# Patient Record
Sex: Female | Born: 1990 | Race: White | Hispanic: No | Marital: Married | Smoking: Never smoker
Health system: Southern US, Community
[De-identification: ages and names within clinical notes are randomized; demographics above are authoritative.]

---

## 2018-08-15 ENCOUNTER — Other Ambulatory Visit: Payer: Self-pay

## 2018-08-15 DIAGNOSIS — X509XXA Other and unspecified overexertion or strenuous movements or postures, initial encounter: Secondary | ICD-10-CM | POA: Diagnosis not present

## 2018-08-15 DIAGNOSIS — Y929 Unspecified place or not applicable: Secondary | ICD-10-CM | POA: Diagnosis not present

## 2018-08-15 DIAGNOSIS — Y998 Other external cause status: Secondary | ICD-10-CM | POA: Diagnosis not present

## 2018-08-15 DIAGNOSIS — Y9351 Activity, roller skating (inline) and skateboarding: Secondary | ICD-10-CM | POA: Insufficient documentation

## 2018-08-15 DIAGNOSIS — Z79899 Other long term (current) drug therapy: Secondary | ICD-10-CM | POA: Diagnosis not present

## 2018-08-15 DIAGNOSIS — S82852A Displaced trimalleolar fracture of left lower leg, initial encounter for closed fracture: Secondary | ICD-10-CM | POA: Insufficient documentation

## 2018-08-15 DIAGNOSIS — S99912A Unspecified injury of left ankle, initial encounter: Secondary | ICD-10-CM | POA: Diagnosis present

## 2018-08-15 NOTE — ED Triage Notes (Signed)
Fell while roller skating.  Reports pain to left ankle.

## 2018-08-16 ENCOUNTER — Emergency Department
Admission: EM | Admit: 2018-08-16 | Discharge: 2018-08-16 | Disposition: A | Discharge status: Home / Self Care | Payer: BLUE CROSS/BLUE SHIELD | Attending: Student in an Organized Health Care Education/Training Program | Admitting: Student in an Organized Health Care Education/Training Program

## 2018-08-16 ENCOUNTER — Emergency Department: Payer: BLUE CROSS/BLUE SHIELD

## 2018-08-16 DIAGNOSIS — T1490XA Injury, unspecified, initial encounter: Secondary | ICD-10-CM

## 2018-08-16 DIAGNOSIS — S82852A Displaced trimalleolar fracture of left lower leg, initial encounter for closed fracture: Secondary | ICD-10-CM

## 2018-08-16 LAB — I-STAT BETA HCG BLOOD, ED (NOT ORDERABLE): I-stat hCG, quantitative: <5 m[IU]/mL (ref <5)

## 2018-08-16 MED ORDER — PROPOFOL 10 MG/ML IV BOLUS
0.5000 mg/kg | Freq: Once | INTRAVENOUS | Status: DC
  Filled 2018-08-16: qty 20

## 2018-08-16 MED ORDER — METOCLOPRAMIDE HCL 5 MG/ML IJ SOLN
10.0000 mg | Freq: Once | INTRAMUSCULAR | Status: AC
  Administered 2018-08-16: 10 mg via INTRAVENOUS
  Filled 2018-08-16: qty 2

## 2018-08-16 MED ORDER — PROPOFOL 10 MG/ML IV BOLUS
INTRAVENOUS | Status: AC | PRN
  Administered 2018-08-16: 35 mg via INTRAVENOUS
  Administered 2018-08-16: 20 mg via INTRAVENOUS

## 2018-08-16 MED ORDER — HYDROCODONE-ACETAMINOPHEN 5-325 MG PO TABS: 1.0000 | ORAL_TABLET | ORAL | 0 refills | Status: DC | PRN

## 2018-08-16 MED ORDER — KETAMINE HCL 10 MG/ML IJ SOLN
INTRAMUSCULAR | Status: AC | PRN
  Administered 2018-08-16: 35 mg via INTRAVENOUS

## 2018-08-16 MED ORDER — PROMETHAZINE HCL 25 MG/ML IJ SOLN
12.5000 mg | Freq: Four times a day (QID) | INTRAMUSCULAR | Status: DC | PRN
  Administered 2018-08-16: 12.5 mg via INTRAVENOUS
  Filled 2018-08-16: qty 1

## 2018-08-16 MED ORDER — MORPHINE SULFATE (PF) 4 MG/ML IV SOLN
4.0000 mg | INTRAVENOUS | Status: DC | PRN
  Administered 2018-08-16: 4 mg via INTRAVENOUS
  Filled 2018-08-16: qty 1

## 2018-08-16 MED ORDER — KETAMINE HCL 10 MG/ML IJ SOLN
INTRAMUSCULAR | Status: AC | PRN
  Administered 2018-08-16: 20 mg via INTRAVENOUS

## 2018-08-16 MED ORDER — KETAMINE HCL 10 MG/ML IJ SOLN
1.0000 mg/kg | Freq: Once | INTRAMUSCULAR | Status: DC
  Filled 2018-08-16: qty 1

## 2018-08-16 MED ORDER — PROPOFOL 10 MG/ML IV BOLUS
INTRAVENOUS | Status: AC | PRN
  Administered 2018-08-16: 30 mg via INTRAVENOUS

## 2018-08-16 NOTE — ED Notes (Signed)
Pt having PO challenge at this time.

## 2018-08-16 NOTE — Discharge Instructions (Signed)
These follow-up with Dr. Rosita KeaMenz of orthopedics by making a phone call to the office on Monday for clinic appointment.  Keep leg elevated while at home to help reduce swelling.  Please take daily baby aspirin to help reduce risk of blood clot while in splint.

## 2018-08-16 NOTE — ED Notes (Signed)
Pt is on medhold at this time.

## 2018-08-16 NOTE — ED Provider Notes (Signed)
[facility] Emergency Department Provider Note    First MD Initiated Contact with Patient 08/16/18 0045     (approximate)  I have reviewed the triage vital signs and the nursing notes.   HISTORY  Chief Complaint Ankle Pain    HPI Holly Wyatt is a 27 y.o. female presents to the ER for evaluation of left ankle pain that occurred while she was rollerblading tonight.  Denies any other injury.  States that she rolled her ankle and felt a popping sensation with immediate severe pain.  Denies any knee pain.  Not any blood thinners.  Previously healthy.  Denies any other associated injury.    No past medical history on file. No family history on file.  There are no active problems to display for this patient.     Prior to Admission medications   Medication Sig Start Date End Date Taking? Authorizing Provider  norethindrone-ethinyl estradiol (JUNEL FE 1/20) 1-20 MG-MCG tablet Take 1 tablet by mouth daily. 08/03/17  Yes [provider]  HYDROcodone-acetaminophen (NORCO) 5-325 MG tablet Take 1 tablet by mouth every 4 (four) hours as needed for moderate pain. 08/16/18   Willy Eddy, MD    Allergies Patient has no known allergies.    Social History Social History   Tobacco Use  . Smoking status: Not on file  Substance Use Topics  . Alcohol use: Not on file  . Drug use: Not on file    Review of Systems Patient denies headaches, rhinorrhea, blurry vision, numbness, shortness of breath, chest pain, edema, cough, abdominal pain, nausea, vomiting, diarrhea, dysuria, fevers, rashes or hallucinations unless otherwise stated above in HPI. ____________________________________________   PHYSICAL EXAM:  VITAL SIGNS: Vitals:   08/16/18 0315 08/16/18 0330  BP: 137/75 (!) 143/86  Pulse: 100 (!) 107  Resp: 19 15  Temp:    SpO2: 100% 100%    Constitutional: Alert and oriented.  Eyes: Conjunctivae are normal.  Head: Atraumatic. Nose: No  congestion/rhinnorhea. Mouth/Throat: Mucous membranes are moist.   Neck: No stridor. Painless ROM.  Cardiovascular: Normal rate, regular rhythm. Grossly normal heart sounds.  Good peripheral circulation. Respiratory: Normal respiratory effort.  No retractions. Lungs CTAB. Gastrointestinal: Soft and nontender. No distention. No abdominal bruits. No CVA tenderness. Genitourinary:  Musculoskeletal: Obvious deformity to left ankle with surrounding swelling and tenderness to both malleoli.  Neurovascular intact distally.  No joint effusions. Neurologic:  Normal speech and language. No gross focal neurologic deficits are appreciated. No facial droop Skin:  Skin is warm, dry and intact. No rash noted. Psychiatric: Mood and affect are normal. Speech and behavior are normal.  ____________________________________________   LABS (all labs ordered are listed, but only abnormal results are displayed)  Results for orders placed or performed during the hospital encounter of 08/16/18 (from the past 24 hour(s))  I-Stat beta hCG blood, ED     Status: None   Collection Time: 08/16/18  1:28 AM  Result Value Ref Range   I-stat hCG, quantitative <5.0 <5 mIU/mL   Comment 3           ____________________________________________ ____________________________________________  RADIOLOGY  I personally reviewed all radiographic images ordered to evaluate for the above acute complaints and reviewed radiology reports and findings.  These findings were personally discussed with the patient.  Please see medical record for radiology report.  ____________________________________________   PROCEDURES  Procedure(s) performed:  .Ortho Injury Treatment Date/Time: 08/16/2018 4:01 AM Performed by: Willy Eddy, MD Authorized by: Willy Eddy, MD  Consent:    Consent obtained:  Verbal   Consent given by:  Patient   Risks discussed:  Irreducible dislocation, recurrent dislocation, stiffness, vascular  damage, restricted joint movement, nerve damage and fracture   Alternatives discussed:  Alternative treatment, no treatment, immobilization and referralInjury location: ankle Location details: left ankle Injury type: fracture-dislocation Fracture type: trimalleolar Pre-procedure neurovascular assessment: neurovascularly intact  Patient sedated: Yes. Refer to sedation procedure documentation for details of sedation. Manipulation performed: yes Skin traction used: yes Reduction successful: yes X-ray confirmed reduction: yes Immobilization: splint and crutches Splint type: ankle stirrup Supplies used: plaster Post-procedure neurovascular assessment: post-procedure neurovascularly intact Patient tolerance: Patient tolerated the procedure well with no immediate complications  .Sedation Date/Time: 08/16/2018 4:02 AM Performed by: Willy Eddyobinson, Quinlynn Cuthbert, MD Authorized by: Willy Eddyobinson, Drewey Begue, MD   Consent:    Consent obtained:  Written (electronic informed consent)   Risks discussed:  Allergic reaction, dysrhythmia, inadequate sedation, nausea, vomiting, respiratory compromise necessitating ventilatory assistance and intubation, prolonged sedation necessitating reversal and prolonged hypoxia resulting in organ damage Universal protocol:    Procedure explained and questions answered to patient or proxy's satisfaction: yes     Relevant documents present and verified: yes     Test results available and properly labeled: yes     Imaging studies available: yes     Required blood products, implants, devices, and special equipment available: yes     Immediately prior to procedure a time out was called: yes     Patient identity confirmation method:  Arm band Pre-sedation assessment:    Time since last food or drink:  4   ASA classification: class 1 - normal, healthy patient     Neck mobility: normal     Mouth opening:  3 or more finger widths   Thyromental distance:  4 finger widths   Mallampati  score:  I - soft palate, uvula, fauces, pillars visible   Pre-sedation assessments completed and reviewed: airway patency, cardiovascular function, mental status, pain level and respiratory function   Immediate pre-procedure details:    Reassessment: Patient reassessed immediately prior to procedure     Reviewed: vital signs, relevant labs/tests and NPO status     Verified: bag valve mask available, emergency equipment available, intubation equipment available, IV patency confirmed, oxygen available, reversal medications available and suction available   Procedure details (see MAR for exact dosages):    Sedation:  Propofol and ketamine   Intra-procedure monitoring:  Blood pressure monitoring, continuous pulse oximetry, cardiac monitor, frequent vital sign checks and frequent LOC assessments   Total Provider sedation time (minutes):  23 Post-procedure details:    Attendance: Constant attendance by certified staff until patient recovered     Recovery: Patient returned to pre-procedure baseline     Post-sedation assessments completed and reviewed: airway patency, cardiovascular function, hydration status, mental status and respiratory function     Patient is stable for discharge or admission: yes     Patient tolerance:  Tolerated well, no immediate complications      Critical Care performed: no ____________________________________________   INITIAL IMPRESSION / ASSESSMENT AND PLAN / ED COURSE  Pertinent labs & imaging results that were available during my care of the patient were reviewed by me and considered in my medical decision making (see chart for details).   DDX: fracture, dislocation, sprain  Holly Wyatt is a 27 y.o. who presents to the ED with acute trimalleolar fracture dislocation.  No other associated injury.  Will provide IV pain medication.  Will require  sedation and reduction.  Clinical Course as of Aug 16 716  Wynelle LinkSun Aug 16, 2018  0119 Consulted with Dr. Rosita KeaMenz of  orthopedics who agrees with plan for reduction in the ER with outpatient follow-up.   [PR]  0247 Patient tolerated reduction without complication.   [PR]    Clinical Course User Index [PR] Willy Eddyobinson, Leighanne Adolph, MD     As part of my medical decision making, I reviewed the following data within the electronic MEDICAL RECORD NUMBER Nursing notes reviewed and incorporated, Labs reviewed, notes from prior ED visits.   ____________________________________________   FINAL CLINICAL IMPRESSION(S) / ED DIAGNOSES  Final diagnoses:  Trimalleolar fracture of ankle, closed, left, initial encounter      NEW MEDICATIONS STARTED DURING THIS VISIT:  Discharge Medication List as of 08/16/2018  3:04 AM       Note:  This document was prepared using Dragon voice recognition software and may include unintentional dictation errors.    Willy Eddyobinson, Dyllan Kats, MD 08/16/18 [phone]

## 2018-08-19 MED ORDER — CEFAZOLIN SODIUM-DEXTROSE 2-4 GM/100ML-% IV SOLN: 2.0000 g | Freq: Once | INTRAVENOUS | Status: DC

## 2018-08-20 ENCOUNTER — Encounter: Payer: Self-pay | Admitting: Anesthesiology

## 2018-08-20 ENCOUNTER — Other Ambulatory Visit: Payer: Self-pay

## 2018-08-20 ENCOUNTER — Ambulatory Visit
Admission: RE | Admit: 2018-08-20 | Discharge: 2018-08-20 | Disposition: A | Discharge status: Home / Self Care | Payer: BLUE CROSS/BLUE SHIELD | Attending: Orthopedic Surgery | Admitting: Orthopedic Surgery

## 2018-08-20 ENCOUNTER — Encounter
Admission: RE | Disposition: A | Discharge status: Home / Self Care | Payer: Self-pay | Source: Home / Self Care | Attending: Orthopedic Surgery

## 2018-08-20 ENCOUNTER — Other Ambulatory Visit
Admission: RE | Admit: 2018-08-20 | Discharge: 2018-08-20 | Disposition: A | Discharge status: Home / Self Care | Payer: BLUE CROSS/BLUE SHIELD | Source: Ambulatory Visit | Attending: *Deleted | Admitting: *Deleted

## 2018-08-20 DIAGNOSIS — S82852A Displaced trimalleolar fracture of left lower leg, initial encounter for closed fracture: Secondary | ICD-10-CM | POA: Insufficient documentation

## 2018-08-20 DIAGNOSIS — R509 Fever, unspecified: Secondary | ICD-10-CM | POA: Insufficient documentation

## 2018-08-20 DIAGNOSIS — R05 Cough: Secondary | ICD-10-CM | POA: Insufficient documentation

## 2018-08-20 DIAGNOSIS — Y9351 Activity, roller skating (inline) and skateboarding: Secondary | ICD-10-CM | POA: Diagnosis not present

## 2018-08-20 DIAGNOSIS — Z5309 Procedure and treatment not carried out because of other contraindication: Secondary | ICD-10-CM | POA: Insufficient documentation

## 2018-08-20 DIAGNOSIS — R42 Dizziness and giddiness: Secondary | ICD-10-CM | POA: Insufficient documentation

## 2018-08-20 LAB — FIBRIN DERIVATIVES D-DIMER (ARMC ONLY): Fibrin derivatives D-dimer (ARMC): 1624.53 ng/mL (FEU) — ABNORMAL HIGH (ref 0.00–499.00)

## 2018-08-20 SURGERY — OPEN REDUCTION INTERNAL FIXATION (ORIF) ANKLE FRACTURE
Anesthesia: Choice | Laterality: Left

## 2018-08-20 MED ORDER — LACTATED RINGERS IV SOLN: INTRAVENOUS | Status: DC

## 2018-08-20 MED ORDER — FAMOTIDINE 20 MG PO TABS: 20.0000 mg | ORAL_TABLET | Freq: Once | ORAL | Status: DC

## 2018-08-20 MED ORDER — FAMOTIDINE 20 MG PO TABS
ORAL_TABLET | ORAL | Status: AC
  Filled 2018-08-20: qty 1

## 2018-08-20 MED ORDER — CEFAZOLIN SODIUM-DEXTROSE 2-4 GM/100ML-% IV SOLN
INTRAVENOUS | Status: AC
  Filled 2018-08-20: qty 100

## 2018-08-20 SURGICAL SUPPLY — 44 items
BANDAGE ACE 4X5 VEL STRL LF (GAUZE/BANDAGES/DRESSINGS) ×6 IMPLANT
CANISTER SUCT 1200ML W/VALVE (MISCELLANEOUS) ×3 IMPLANT
CHLORAPREP W/TINT 26ML (MISCELLANEOUS) ×3 IMPLANT
COVER WAND RF STERILE (DRAPES) ×3 IMPLANT
CUFF TOURN 24 STER (MISCELLANEOUS) IMPLANT
CUFF TOURN 30 STER DUAL PORT (MISCELLANEOUS) IMPLANT
DRAPE FLUOR MINI C-ARM 54X84 (DRAPES) ×3 IMPLANT
DRAPE INCISE IOBAN 66X45 STRL (DRAPES) ×3 IMPLANT
DRAPE U-SHAPE 47X51 STRL (DRAPES) ×3 IMPLANT
DRSG EMULSION OIL 3X8 NADH (GAUZE/BANDAGES/DRESSINGS) ×3 IMPLANT
ELECT CAUTERY BLADE 6.4 (BLADE) ×3 IMPLANT
ELECT REM PT RETURN 9FT ADLT (ELECTROSURGICAL) ×3
ELECTRODE REM PT RTRN 9FT ADLT (ELECTROSURGICAL) ×1 IMPLANT
GAUZE PETRO XEROFOAM 1X8 (MISCELLANEOUS) ×3 IMPLANT
GAUZE SPONGE 4X4 12PLY STRL (GAUZE/BANDAGES/DRESSINGS) ×3 IMPLANT
GLOVE SURG SYN 9.0  PF PI (GLOVE) ×2
GLOVE SURG SYN 9.0 PF PI (GLOVE) ×1 IMPLANT
GOWN SRG 2XL LVL 4 RGLN SLV (GOWNS) ×1 IMPLANT
GOWN STRL NON-REIN 2XL LVL4 (GOWNS) ×2
GOWN STRL REUS W/ TWL LRG LVL3 (GOWN DISPOSABLE) ×1 IMPLANT
GOWN STRL REUS W/TWL LRG LVL3 (GOWN DISPOSABLE) ×2
HEMOVAC 400ML (MISCELLANEOUS)
KIT DRAIN HEMOVAC JP 7FR 400ML (MISCELLANEOUS) IMPLANT
KIT TURNOVER KIT A (KITS) ×3 IMPLANT
LABEL OR SOLS (LABEL) ×3 IMPLANT
NS IRRIG 1000ML POUR BTL (IV SOLUTION) ×3 IMPLANT
PACK EXTREMITY ARMC (MISCELLANEOUS) ×3 IMPLANT
PAD ABD DERMACEA PRESS 5X9 (GAUZE/BANDAGES/DRESSINGS) ×6 IMPLANT
PAD CAST CTTN 4X4 STRL (SOFTGOODS) ×2 IMPLANT
PAD PREP 24X41 OB/GYN DISP (PERSONAL CARE ITEMS) ×3 IMPLANT
PADDING CAST COTTON 4X4 STRL (SOFTGOODS) ×4
SCALPEL PROTECTED #15 DISP (BLADE) ×6 IMPLANT
SPLINT CAST 1 STEP 5X30 WHT (MISCELLANEOUS) ×3 IMPLANT
SPONGE LAP 18X18 RF (DISPOSABLE) ×3 IMPLANT
STAPLER SKIN PROX 35W (STAPLE) ×3 IMPLANT
SUT ETHILON 3-0 FS-10 30 BLK (SUTURE) ×3
SUT MNCRL AB 4-0 PS2 18 (SUTURE) ×6 IMPLANT
SUT VIC AB 0 CT1 36 (SUTURE) ×3 IMPLANT
SUT VIC AB 2-0 SH 27 (SUTURE) ×4
SUT VIC AB 2-0 SH 27XBRD (SUTURE) ×2 IMPLANT
SUT VIC AB 3-0 SH 27 (SUTURE) ×2
SUT VIC AB 3-0 SH 27X BRD (SUTURE) ×1 IMPLANT
SUTURE EHLN 3-0 FS-10 30 BLK (SUTURE) ×1 IMPLANT
SYR 10ML LL (SYRINGE) ×3 IMPLANT

## 2018-08-20 NOTE — Anesthesia Preprocedure Evaluation (Deleted)
Anesthesia Evaluation Anesthesia Physical Anesthesia Plan  ASA:   Anesthesia Plan:    Post-op Pain Management:    Induction:   PONV Risk Score and Plan:   Airway Management Planned:   Additional Equipment:   Intra-op Plan:   Post-operative Plan:   Informed Consent:   Plan Discussed with:   Anesthesia Plan Comments: (Pt with cold/cough over last 2 days with fever spikes to 102. Spoke with pt and Dr. Rosita KeaMenz. Will postpone for now. Pt will seek treatment for symptoms.)        Anesthesia Quick Evaluation

## 2018-08-27 ENCOUNTER — Ambulatory Visit
Admission: RE | Admit: 2018-08-27 | Payer: BLUE CROSS/BLUE SHIELD | Source: Home / Self Care | Admitting: Orthopedic Surgery

## 2018-08-27 ENCOUNTER — Encounter: Admission: RE | Payer: Self-pay | Source: Home / Self Care

## 2018-08-27 SURGERY — OPEN REDUCTION INTERNAL FIXATION (ORIF) ANKLE FRACTURE
Anesthesia: Choice | Laterality: Left

## 2019-05-06 ENCOUNTER — Other Ambulatory Visit: Payer: Self-pay

## 2019-05-06 DIAGNOSIS — Z20822 Contact with and (suspected) exposure to covid-19: Secondary | ICD-10-CM

## 2019-05-07 LAB — NOVEL CORONAVIRUS, NAA: SARS-CoV-2, NAA: NOT DETECTED

## 2021-12-31 ENCOUNTER — Observation Stay: Payer: BLUE CROSS/BLUE SHIELD | Admitting: Certified Registered"

## 2021-12-31 ENCOUNTER — Other Ambulatory Visit: Payer: Self-pay

## 2021-12-31 ENCOUNTER — Emergency Department: Payer: BLUE CROSS/BLUE SHIELD

## 2021-12-31 ENCOUNTER — Observation Stay
Admission: EM | Admit: 2021-12-31 | Discharge: 2022-01-01 | Disposition: A | Discharge status: Home / Self Care | Payer: BLUE CROSS/BLUE SHIELD | Attending: General Surgery | Admitting: General Surgery

## 2021-12-31 ENCOUNTER — Encounter: Payer: Self-pay | Admitting: Emergency Medicine

## 2021-12-31 ENCOUNTER — Encounter
Admission: EM | Disposition: A | Discharge status: Home / Self Care | Payer: Self-pay | Source: Home / Self Care | Attending: Emergency Medicine

## 2021-12-31 DIAGNOSIS — Z7982 Long term (current) use of aspirin: Secondary | ICD-10-CM | POA: Diagnosis not present

## 2021-12-31 DIAGNOSIS — K353 Acute appendicitis with localized peritonitis, without perforation or gangrene: Secondary | ICD-10-CM | POA: Diagnosis not present

## 2021-12-31 DIAGNOSIS — R1031 Right lower quadrant pain: Secondary | ICD-10-CM | POA: Diagnosis present

## 2021-12-31 DIAGNOSIS — K3580 Unspecified acute appendicitis: Secondary | ICD-10-CM | POA: Diagnosis present

## 2021-12-31 HISTORY — PX: XI ROBOTIC LAPAROSCOPIC ASSISTED APPENDECTOMY: SHX6877

## 2021-12-31 LAB — CBC
HCT: 40.4 % (ref 36.0–46.0)
Hemoglobin: 13 g/dL (ref 12.0–15.0)
MCH: 27.5 pg (ref 26.0–34.0)
MCHC: 32.2 g/dL (ref 30.0–36.0)
MCV: 85.4 fL (ref 80.0–100.0)
Platelets: 195 10*3/uL (ref 150–400)
RBC: 4.73 MIL/uL (ref 3.87–5.11)
RDW: 13.5 % (ref 11.5–15.5)
WBC: 12.3 10*3/uL — ABNORMAL HIGH (ref 4.0–10.5)
nRBC: 0 % (ref 0.0–0.2)

## 2021-12-31 LAB — URINALYSIS, ROUTINE W REFLEX MICROSCOPIC
Bilirubin Urine: NEGATIVE
Glucose, UA: NEGATIVE mg/dL
Hgb urine dipstick: NEGATIVE
Ketones, ur: NEGATIVE mg/dL
Leukocytes,Ua: NEGATIVE
Nitrite: NEGATIVE
Protein, ur: NEGATIVE mg/dL
Specific Gravity, Urine: 1.013 (ref 1.005–1.030)
pH: 6 (ref 5.0–8.0)

## 2021-12-31 LAB — COMPREHENSIVE METABOLIC PANEL
ALT: 12 U/L (ref 0–44)
AST: 18 U/L (ref 15–41)
Albumin: 4.3 g/dL (ref 3.5–5.0)
Alkaline Phosphatase: 58 U/L (ref 38–126)
Anion gap: 5 (ref 5–15)
BUN: 8 mg/dL (ref 6–20)
CO2: 26 mmol/L (ref 22–32)
Calcium: 9.3 mg/dL (ref 8.9–10.3)
Chloride: 106 mmol/L (ref 98–111)
Creatinine, Ser: 0.48 mg/dL (ref 0.44–1.00)
GFR, Estimated: >60 mL/min (ref >60)
Glucose, Bld: 100 mg/dL — ABNORMAL HIGH (ref 70–99)
Potassium: 3.7 mmol/L (ref 3.5–5.1)
Sodium: 137 mmol/L (ref 135–145)
Total Bilirubin: 0.9 mg/dL (ref 0.3–1.2)
Total Protein: 7.6 g/dL (ref 6.5–8.1)

## 2021-12-31 LAB — POC URINE PREG, ED: Preg Test, Ur: NEGATIVE

## 2021-12-31 LAB — LIPASE, BLOOD: Lipase: 31 U/L (ref 11–51)

## 2021-12-31 SURGERY — APPENDECTOMY, ROBOT-ASSISTED, LAPAROSCOPIC
Anesthesia: General | Site: Abdomen

## 2021-12-31 MED ORDER — 0.9 % SODIUM CHLORIDE (POUR BTL) OPTIME
TOPICAL | Status: DC | PRN
  Administered 2021-12-31: 500 mL

## 2021-12-31 MED ORDER — FENTANYL CITRATE (PF) 100 MCG/2ML IJ SOLN
25.0000 ug | INTRAMUSCULAR | Status: DC | PRN
  Administered 2021-12-31: 50 ug via INTRAVENOUS

## 2021-12-31 MED ORDER — FENTANYL CITRATE (PF) 100 MCG/2ML IJ SOLN
INTRAMUSCULAR | Status: DC | PRN
  Administered 2021-12-31: 100 ug via INTRAVENOUS

## 2021-12-31 MED ORDER — IOHEXOL 300 MG/ML  SOLN
100.0000 mL | Freq: Once | INTRAMUSCULAR | Status: AC | PRN
  Administered 2021-12-31: 100 mL via INTRAVENOUS
  Filled 2021-12-31: qty 100

## 2021-12-31 MED ORDER — SODIUM CHLORIDE 0.9 % IV BOLUS: 1000.0000 mL | Freq: Once | INTRAVENOUS | Status: DC

## 2021-12-31 MED ORDER — SODIUM CHLORIDE 0.9 % IV SOLN: INTRAVENOUS | Status: DC

## 2021-12-31 MED ORDER — LIDOCAINE HCL (CARDIAC) PF 100 MG/5ML IV SOSY
PREFILLED_SYRINGE | INTRAVENOUS | Status: DC | PRN
  Administered 2021-12-31: 100 mg via INTRAVENOUS

## 2021-12-31 MED ORDER — ONDANSETRON HCL 4 MG/2ML IJ SOLN
4.0000 mg | Freq: Once | INTRAMUSCULAR | Status: DC
  Filled 2021-12-31: qty 2

## 2021-12-31 MED ORDER — OXYCODONE HCL 5 MG PO TABS
ORAL_TABLET | ORAL | Status: AC
  Filled 2021-12-31: qty 1

## 2021-12-31 MED ORDER — PROPOFOL 10 MG/ML IV BOLUS
INTRAVENOUS | Status: DC | PRN
  Administered 2021-12-31: 200 mg via INTRAVENOUS

## 2021-12-31 MED ORDER — BUPIVACAINE-EPINEPHRINE (PF) 0.5% -1:200000 IJ SOLN
INTRAMUSCULAR | Status: DC | PRN
  Administered 2021-12-31: 30 mL

## 2021-12-31 MED ORDER — BUPIVACAINE-EPINEPHRINE (PF) 0.5% -1:200000 IJ SOLN
INTRAMUSCULAR | Status: AC
  Filled 2021-12-31: qty 30

## 2021-12-31 MED ORDER — ONDANSETRON 4 MG PO TBDP
4.0000 mg | ORAL_TABLET | Freq: Four times a day (QID) | ORAL | Status: DC | PRN
Start: 2021-12-31 — End: 2022-01-01
  Filled 2021-12-31: qty 1

## 2021-12-31 MED ORDER — MIDAZOLAM HCL 2 MG/2ML IJ SOLN
INTRAMUSCULAR | Status: DC | PRN
  Administered 2021-12-31: 2 mg via INTRAVENOUS

## 2021-12-31 MED ORDER — ACETAMINOPHEN 10 MG/ML IV SOLN
INTRAVENOUS | Status: AC
  Filled 2021-12-31: qty 100

## 2021-12-31 MED ORDER — ACETAMINOPHEN 650 MG RE SUPP: 650.0000 mg | Freq: Four times a day (QID) | RECTAL | Status: DC | PRN

## 2021-12-31 MED ORDER — MORPHINE SULFATE (PF) 4 MG/ML IV SOLN
4.0000 mg | INTRAVENOUS | Status: DC | PRN
  Administered 2021-12-31: 4 mg via INTRAVENOUS
  Filled 2021-12-31: qty 1

## 2021-12-31 MED ORDER — ROCURONIUM BROMIDE 100 MG/10ML IV SOLN
INTRAVENOUS | Status: DC | PRN
  Administered 2021-12-31: 50 mg via INTRAVENOUS

## 2021-12-31 MED ORDER — MIDAZOLAM HCL 2 MG/2ML IJ SOLN
INTRAMUSCULAR | Status: AC
Start: 2021-12-31 — End: ?
  Filled 2021-12-31: qty 2

## 2021-12-31 MED ORDER — SUGAMMADEX SODIUM 200 MG/2ML IV SOLN
INTRAVENOUS | Status: DC | PRN
  Administered 2021-12-31: 200 mg via INTRAVENOUS

## 2021-12-31 MED ORDER — OXYCODONE HCL 5 MG PO TABS
5.0000 mg | ORAL_TABLET | Freq: Once | ORAL | Status: AC | PRN
  Administered 2021-12-31: 5 mg via ORAL

## 2021-12-31 MED ORDER — HYDROCODONE-ACETAMINOPHEN 5-325 MG PO TABS
1.0000 | ORAL_TABLET | ORAL | Status: DC | PRN
  Filled 2021-12-31: qty 1

## 2021-12-31 MED ORDER — DEXMEDETOMIDINE (PRECEDEX) IN NS 20 MCG/5ML (4 MCG/ML) IV SYRINGE
PREFILLED_SYRINGE | INTRAVENOUS | Status: DC | PRN
  Administered 2021-12-31: 12 ug via INTRAVENOUS
  Administered 2021-12-31 (×2): 8 ug via INTRAVENOUS
  Administered 2021-12-31: 12 ug via INTRAVENOUS

## 2021-12-31 MED ORDER — FENTANYL CITRATE (PF) 100 MCG/2ML IJ SOLN
INTRAMUSCULAR | Status: AC
  Filled 2021-12-31: qty 2

## 2021-12-31 MED ORDER — FENTANYL CITRATE (PF) 100 MCG/2ML IJ SOLN
INTRAMUSCULAR | Status: AC
  Administered 2021-12-31: 50 ug via INTRAVENOUS
  Filled 2021-12-31: qty 2

## 2021-12-31 MED ORDER — OXYCODONE HCL 5 MG/5ML PO SOLN: 5.0000 mg | Freq: Once | ORAL | Status: AC | PRN

## 2021-12-31 MED ORDER — ONDANSETRON HCL 4 MG/2ML IJ SOLN
INTRAMUSCULAR | Status: DC | PRN
  Administered 2021-12-31: 4 mg via INTRAVENOUS

## 2021-12-31 MED ORDER — ACETAMINOPHEN 325 MG PO TABS: 650.0000 mg | ORAL_TABLET | Freq: Four times a day (QID) | ORAL | Status: DC | PRN

## 2021-12-31 MED ORDER — PIPERACILLIN-TAZOBACTAM 3.375 G IVPB
3.3750 g | Freq: Three times a day (TID) | INTRAVENOUS | Status: DC
  Administered 2021-12-31 – 2022-01-01 (×3): 3.375 g via INTRAVENOUS
  Filled 2021-12-31 (×4): qty 50

## 2021-12-31 MED ORDER — PROPOFOL 10 MG/ML IV BOLUS
INTRAVENOUS | Status: AC
  Filled 2021-12-31: qty 20

## 2021-12-31 MED ORDER — ENOXAPARIN SODIUM 40 MG/0.4ML IJ SOSY
40.0000 mg | PREFILLED_SYRINGE | INTRAMUSCULAR | Status: DC
  Administered 2021-12-31: 40 mg via SUBCUTANEOUS
  Filled 2021-12-31: qty 0.4

## 2021-12-31 MED ORDER — DEXAMETHASONE SODIUM PHOSPHATE 10 MG/ML IJ SOLN
INTRAMUSCULAR | Status: DC | PRN
  Administered 2021-12-31: 10 mg via INTRAVENOUS

## 2021-12-31 MED ORDER — ONDANSETRON HCL 4 MG/2ML IJ SOLN
4.0000 mg | Freq: Four times a day (QID) | INTRAMUSCULAR | Status: DC | PRN
  Administered 2021-12-31 (×2): 4 mg via INTRAVENOUS
  Filled 2021-12-31: qty 2

## 2021-12-31 MED ORDER — ACETAMINOPHEN 10 MG/ML IV SOLN
INTRAVENOUS | Status: DC | PRN
Start: 2021-12-31 — End: 2021-12-31
  Administered 2021-12-31: 1000 mg via INTRAVENOUS

## 2021-12-31 SURGICAL SUPPLY — 66 items
BAG INFUSER PRESSURE 100CC (MISCELLANEOUS) IMPLANT
BAG RETRIEVAL 10 (BASKET) ×1
BLADE SURG SZ11 CARB STEEL (BLADE) ×2 IMPLANT
CANNULA REDUC XI 12-8 STAPL (CANNULA) ×1
CANNULA REDUCER 12-8 DVNC XI (CANNULA) ×1 IMPLANT
COVER TIP SHEARS 8 DVNC (MISCELLANEOUS) ×1 IMPLANT
COVER TIP SHEARS 8MM DA VINCI (MISCELLANEOUS) ×1
DERMABOND ADVANCED (GAUZE/BANDAGES/DRESSINGS) ×1
DERMABOND ADVANCED .7 DNX12 (GAUZE/BANDAGES/DRESSINGS) ×1 IMPLANT
DRAPE ARM DVNC X/XI (DISPOSABLE) ×4 IMPLANT
DRAPE COLUMN DVNC XI (DISPOSABLE) ×1 IMPLANT
DRAPE DA VINCI XI ARM (DISPOSABLE) ×4
DRAPE DA VINCI XI COLUMN (DISPOSABLE) ×1
ELECT REM PT RETURN 9FT ADLT (ELECTROSURGICAL) ×2
ELECTRODE REM PT RTRN 9FT ADLT (ELECTROSURGICAL) ×1 IMPLANT
GLOVE BIOGEL PI IND STRL 6.5 (GLOVE) ×2 IMPLANT
GLOVE BIOGEL PI INDICATOR 6.5 (GLOVE) ×2
GLOVE SURG SYN 6.5 ES PF (GLOVE) ×4 IMPLANT
GLOVE SURG SYN 6.5 PF PI (GLOVE) ×2 IMPLANT
GOWN STRL REUS W/ TWL LRG LVL3 (GOWN DISPOSABLE) ×3 IMPLANT
GOWN STRL REUS W/TWL LRG LVL3 (GOWN DISPOSABLE) ×3
GRASPER SUT TROCAR 14GX15 (MISCELLANEOUS) IMPLANT
IRRIGATOR SUCT 8 DISP DVNC XI (IRRIGATION / IRRIGATOR) IMPLANT
IRRIGATOR SUCTION 8MM XI DISP (IRRIGATION / IRRIGATOR)
IV NS 1000ML (IV SOLUTION)
IV NS 1000ML BAXH (IV SOLUTION) IMPLANT
KIT PINK PAD W/HEAD ARE REST (MISCELLANEOUS) ×2 IMPLANT
KIT PINK PAD W/HEAD ARM REST (MISCELLANEOUS) ×1 IMPLANT
LABEL OR SOLS (LABEL) IMPLANT
MANIFOLD NEPTUNE II (INSTRUMENTS) ×2 IMPLANT
NDL INSUFFLATION 14GA 120MM (NEEDLE) ×1 IMPLANT
NEEDLE HYPO 22GX1.5 SAFETY (NEEDLE) ×2 IMPLANT
NEEDLE INSUFFLATION 14GA 120MM (NEEDLE) ×2 IMPLANT
OBTURATOR OPTICAL STANDARD 8MM (TROCAR) ×1
OBTURATOR OPTICAL STND 8 DVNC (TROCAR) ×1
OBTURATOR OPTICALSTD 8 DVNC (TROCAR) ×1 IMPLANT
PACK LAP CHOLECYSTECTOMY (MISCELLANEOUS) ×2 IMPLANT
RELOAD STAPLE 45 2.5 WHT DVNC (STAPLE) IMPLANT
RELOAD STAPLE 45 3.5 BLU DVNC (STAPLE) ×1 IMPLANT
RELOAD STAPLER 2.5X45 WHT DVNC (STAPLE) IMPLANT
RELOAD STAPLER 3.5X45 BLU DVNC (STAPLE) ×1 IMPLANT
SEAL CANN UNIV 5-8 DVNC XI (MISCELLANEOUS) ×3 IMPLANT
SEAL XI 5MM-8MM UNIVERSAL (MISCELLANEOUS) ×3
SEALER VESSEL DA VINCI XI (MISCELLANEOUS) ×1
SEALER VESSEL EXT DVNC XI (MISCELLANEOUS) IMPLANT
SET TUBE SMOKE EVAC HIGH FLOW (TUBING) ×2 IMPLANT
SOLUTION ELECTROLUBE (MISCELLANEOUS) ×2 IMPLANT
SPONGE T-LAP 4X18 ~~LOC~~+RFID (SPONGE) ×2 IMPLANT
STAPLER 45 DA VINCI SURE FORM (STAPLE) ×1
STAPLER 45 SUREFORM DVNC (STAPLE) ×1 IMPLANT
STAPLER CANNULA SEAL DVNC XI (STAPLE) ×1 IMPLANT
STAPLER CANNULA SEAL XI (STAPLE) ×1
STAPLER RELOAD 2.5X45 WHITE (STAPLE)
STAPLER RELOAD 2.5X45 WHT DVNC (STAPLE)
STAPLER RELOAD 3.5X45 BLU DVNC (STAPLE) ×1
STAPLER RELOAD 3.5X45 BLUE (STAPLE) ×1
SUT MNCRL AB 4-0 PS2 18 (SUTURE) ×3 IMPLANT
SUT VIC AB 2-0 SH 27 (SUTURE) ×1
SUT VIC AB 2-0 SH 27XBRD (SUTURE) ×1 IMPLANT
SUT VICRYL 0 AB UR-6 (SUTURE) ×2 IMPLANT
SUT VLOC 90 6 CV-15 VIOLET (SUTURE) ×2 IMPLANT
SYR 30ML LL (SYRINGE) ×2 IMPLANT
SYS BAG RETRIEVAL 10MM (BASKET) ×1
SYSTEM BAG RETRIEVAL 10MM (BASKET) ×1 IMPLANT
TRAY FOLEY MTR SLVR 16FR STAT (SET/KITS/TRAYS/PACK) ×2 IMPLANT
WATER STERILE IRR 500ML POUR (IV SOLUTION) ×2 IMPLANT

## 2021-12-31 NOTE — Anesthesia Preprocedure Evaluation (Signed)
Anesthesia Evaluation  ?Patient identified by MRN, date of birth, ID band ?Patient awake ? ? ? ?Reviewed: ?Allergy & Precautions, NPO status , Patient's Chart, lab work & pertinent test results ? ?History of Anesthesia Complications ?Negative for: history of anesthetic complications ? ?Airway ?Mallampati: III ? ?TM Distance: >3 FB ?Neck ROM: full ? ? ? Dental ? ?(+) Chipped ?  ?Pulmonary ?neg pulmonary ROS, neg shortness of breath,  ?  ?Pulmonary exam normal ? ? ? ? ? ? ? Cardiovascular ?(-) angina(-) Past MI and (-) DOE negative cardio ROS ?Normal cardiovascular exam ? ? ?  ?Neuro/Psych ?negative neurological ROS ? negative psych ROS  ? GI/Hepatic ?negative GI ROS, Neg liver ROS,   ?Endo/Other  ?negative endocrine ROS ? Renal/GU ?  ? ?  ?Musculoskeletal ? ? Abdominal ?  ?Peds ? Hematology ?negative hematology ROS ?(+)   ?Anesthesia Other Findings ?History reviewed. No pertinent past medical history. ? ?History reviewed. No pertinent surgical history. ? ?BMI   ? Body Mass Index: 27.86 kg/m?  ?  ? ? Reproductive/Obstetrics ?negative OB ROS ? ?  ? ? ? ? ? ? ? ? ? ? ? ? ? ?  ?  ? ? ? ? ? ? ? ? ?Anesthesia Physical ?Anesthesia Plan ? ?ASA: 1 ? ?Anesthesia Plan: General ETT  ? ?Post-op Pain Management:   ? ?Induction: Intravenous ? ?PONV Risk Score and Plan: Ondansetron, Dexamethasone, Midazolam and Treatment may vary due to age or medical condition ? ?Airway Management Planned: Oral ETT ? ?Additional Equipment:  ? ?Intra-op Plan:  ? ?Post-operative Plan: Extubation in OR ? ?Informed Consent: I have reviewed the patients History and Physical, chart, labs and discussed the procedure including the risks, benefits and alternatives for the proposed anesthesia with the patient or authorized representative who has indicated his/her understanding and acceptance.  ? ? ? ?Dental Advisory Given ? ?Plan Discussed with: Anesthesiologist, CRNA and Surgeon ? ?Anesthesia Plan Comments: (Patient  consented for risks of anesthesia including but not limited to:  ?- adverse reactions to medications ?- damage to eyes, teeth, lips or other oral mucosa ?- nerve damage due to positioning  ?- sore throat or hoarseness ?- Damage to heart, brain, nerves, lungs, other parts of body or loss of life ? ?Patient voiced understanding.)  ? ? ? ? ? ? ?Anesthesia Quick Evaluation ? ?

## 2021-12-31 NOTE — ED Notes (Signed)
Report given to [facility] RN pt to OR with OR tech.  ?

## 2021-12-31 NOTE — H&P (Signed)
SURGICAL CONSULTATION NOTE  ? ?HISTORY OF PRESENT ILLNESS (HPI):  ?31 y.o. female presented to [facility] ED for evaluation of abdominal pain. Patient reports pain started last night.  Pain localized on the mid abdomen and radiated to the right lower quadrant.  Pain aggravated by moving the abdominal wall.  There has been no alleviating factors.  Patient denies any fever or chills. ? ?At the ED she was found with tenderness to palpation in the right lower quadrant.  There is no fever.  Stable vital signs.  Labs shows white blood cell count of 12,000.  CT scan of the abdomen and pelvis shows thickness of the appendix with associated stranding.  There is no free air or free fluid.  I personally evaluated the images. ? ?Surgery is consulted by Dr. Tamala Julian in this context for evaluation and management of acute appendicitis. ? ?PAST MEDICAL HISTORY ([facility]):  ?History reviewed. No pertinent past medical history.  ? ?PAST SURGICAL HISTORY ([place]):  ?History reviewed. No pertinent surgical history.  ? ?MEDICATIONS:  ?Prior to Admission medications   ?Medication Sig Start Date End Date Taking? Authorizing Provider  ?aspirin 325 MG tablet Take 325 mg by mouth 3 (three) times daily as needed for moderate pain.    [provider]  ?norethindrone-ethinyl estradiol-FE (LOESTRIN FE) 1-20 MG-MCG tablet Take 1 tablet by mouth daily. 08/03/17   [provider]  ?  ? ?ALLERGIES:  ?No Known Allergies  ? ?SOCIAL HISTORY:  ?Social History  ? ?Socioeconomic History  ? Marital status: Married  ?  Spouse name: Not on file  ? Number of children: Not on file  ? Years of education: Not on file  ? Highest education level: Not on file  ?Occupational History  ? Not on file  ?Tobacco Use  ? Smoking status: Never  ? Smokeless tobacco: Never  ?Vaping Use  ? Vaping Use: Never used  ?Substance and Sexual Activity  ? Alcohol use: Yes  ?  Alcohol/week: 2.0 standard drinks  ?  Types: 2 Standard drinks or equivalent per week  ?  Comment: monthly  ?  Drug use: Never  ? Sexual activity: Yes  ?Other Topics Concern  ? Not on file  ?Social History Narrative  ? Not on file  ? ?Social Determinants of Health  ? ?Financial Resource Strain: Not on file  ?Food Insecurity: Not on file  ?Transportation Needs: Not on file  ?Physical Activity: Not on file  ?Stress: Not on file  ?Social Connections: Not on file  ?Intimate Partner Violence: Not on file  ?  ? ? ?FAMILY HISTORY:  ?No family history on file.  ? ?REVIEW OF SYSTEMS:  ?Constitutional: denies weight loss, fever, chills, or sweats  ?Eyes: denies any other vision changes, history of eye injury  ?ENT: denies sore throat, hearing problems  ?Respiratory: denies shortness of breath, wheezing  ?Cardiovascular: denies chest pain, palpitations  ?Gastrointestinal: positive abdominal pain  ?Genitourinary: denies burning with urination or urinary frequency ?Musculoskeletal: denies any other joint pains or cramps  ?Skin: denies any other rashes or skin discolorations  ?Neurological: denies any other headache, dizziness, weakness  ?Psychiatric: denies any other depression, anxiety  ? ?All other review of systems were negative  ? ?VITAL SIGNS:  ?Temp:  [99.3 ?F (37.4 ?C)] 99.3 ?F (37.4 ?C) (05/08 1255) ?Pulse Rate:  [92] 92 (05/08 1255) ?Resp:  [18] 18 (05/08 1255) ?BP: (125)/(72) 125/72 (05/08 1255) ?SpO2:  [100 %] 100 % (05/08 1255) ?Weight:  [80.7 kg] 80.7 kg (05/08 1256)  Height: 5\' 7"  (170.2 cm) Weight: 80.7 kg BMI (Calculated): 27.87  ? ?INTAKE/OUTPUT:  ?This shift: No intake/output data recorded.  ?Last 2 shifts: @IOLAST2SHIFTS @  ? ?PHYSICAL EXAM:  ?Constitutional:  ?-- Normal body habitus  ?-- Awake, alert, and oriented x3  ?Eyes:  ?-- Pupils equally round and reactive to light  ?-- No scleral icterus  ?Ear, nose, and throat:  ?-- No jugular venous distension  ?Pulmonary:  ?-- No crackles  ?-- Equal breath sounds bilaterally ?-- Breathing non-labored at rest ?Cardiovascular:  ?-- S1, S2 present  ?-- No pericardial  rubs ?Gastrointestinal:  ?-- Abdomen soft, tender to palpation in the right lower quadrant, non-distended, no guarding or rebound tenderness ?-- No abdominal masses appreciated, pulsatile or otherwise  ?Musculoskeletal and Integumentary:  ?-- Wounds or skin discoloration: None appreciated ?-- Extremities: B/L UE and LE FROM, hands and feet warm, no edema  ?Neurologic:  ?-- Motor function: intact and symmetric ?-- Sensation: intact and symmetric ? ? ?Labs:  ? ?  Latest Ref Rng & Units 12/31/2021  ?  1:04 PM  ?CBC  ?WBC 4.0 - 10.5 K/uL 12.3    ?Hemoglobin 12.0 - 15.0 g/dL 13.0    ?Hematocrit 36.0 - 46.0 % 40.4    ?Platelets 150 - 400 K/uL 195    ? ? ?  Latest Ref Rng & Units 12/31/2021  ?  1:04 PM  ?CMP  ?Glucose 70 - 99 mg/dL 100    ?BUN 6 - 20 mg/dL 8    ?Creatinine 0.44 - 1.00 mg/dL 0.48    ?Sodium 135 - 145 mmol/L 137    ?Potassium 3.5 - 5.1 mmol/L 3.7    ?Chloride 98 - 111 mmol/L 106    ?CO2 22 - 32 mmol/L 26    ?Calcium 8.9 - 10.3 mg/dL 9.3    ?Total Protein 6.5 - 8.1 g/dL 7.6    ?Total Bilirubin 0.3 - 1.2 mg/dL 0.9    ?Alkaline Phos 38 - 126 U/L 58    ?AST 15 - 41 U/L 18    ?ALT 0 - 44 U/L 12    ? ? ? ?Imaging studies:  ?CLINICAL DATA:  Right lower quadrant abdominal pain with nausea and ?vomiting of unspecified duration. Leukocytosis. ?  ?EXAM: ?CT ABDOMEN AND PELVIS WITH CONTRAST ?  ?TECHNIQUE: ?Multidetector CT imaging of the abdomen and pelvis was performed ?using the standard protocol following bolus administration of ?intravenous contrast. ?  ?RADIATION DOSE REDUCTION: This exam was performed according to the ?departmental dose-optimization program which includes automated ?exposure control, adjustment of the mA and/or kV according to ?patient size and/or use of iterative reconstruction technique. ?  ?CONTRAST:  192mL OMNIPAQUE IOHEXOL 300 MG/ML  SOLN ?  ?COMPARISON:  None Available. ?  ?FINDINGS: ?Lower chest: Clear lung bases. No significant pleural or pericardial ?effusion. ?  ?Hepatobiliary: The liver is  normal in density without suspicious ?focal abnormality. No evidence of gallstones, gallbladder wall ?thickening or biliary dilatation. ?  ?Pancreas: Unremarkable. No pancreatic ductal dilatation or ?surrounding inflammatory changes. ?  ?Spleen: Normal in size without focal abnormality. ?  ?Adrenals/Urinary Tract: Both adrenal glands appear normal. ?Assessment for small renal calculi limited by contrast in the ?calices. No evidence of ureteral calculus or hydronephrosis. There ?is no focal renal abnormality. The bladder appears unremarkable for ?its degree of distention. ?  ?Stomach/Bowel: No enteric contrast administered. The stomach and ?small bowel appear unremarkable. Retrocecal appendix located in the ?right false pelvis demonstrates wall thickening, distension to 11 mm ?and moderate  surrounding inflammation consistent with acute ?appendicitis. No focal extraluminal fluid or air collection ?identified. Moderate stool throughout the colon. No evidence of ?colonic wall thickening or significant distention. ?  ?Vascular/Lymphatic: There are no enlarged abdominal or pelvic lymph ?nodes. No significant vascular findings. ?  ?Reproductive: The uterus and ovaries appear unremarkable. No adnexal ?mass. ?  ?Other: As above, right lower quadrant inflammatory changes ?consistent with acute appendicitis. Inflammatory changes extend ?along the right posterior pararenal fascia. No focal fluid ?collection, ascites or pneumoperitoneum. ?  ?Musculoskeletal: No acute or significant osseous findings. ?  ?IMPRESSION: ?1. Findings consistent with acute appendicitis. There are moderate ?peri-appendiceal inflammatory changes, but no definitive signs of ?rupture or abscess. ?2. These results were called by telephone at the time of ?interpretation on 12/31/2021 at 2:29 pm to Mardee Postin, [place] for ?provider JENISE MENSHEW , who verbally acknowledged these results. ?  ?  ?Electronically Signed ?  By: Richardean Sale M.D. ?  On:  12/31/2021 14:30 ?  ? ?Assessment/Plan:  ?31 y.o. female with acute appendicitis. ? ?Patient with history, physical exam and images consistent with acute appendicitis. Patient oriented about diagnosis and surgical m

## 2021-12-31 NOTE — ED Provider Notes (Signed)
? ? ?[facility] ?Emergency Department Provider Note ? ? ? ? Event Date/Time  ? First MD Initiated Contact with Patient 12/31/21 1346   ?  (approximate) ? ? ?History  ? ?Abdominal Pain ? ? ?HPI ? ?Holly Wyatt is a 31 y.o. female 12 weeks postpartum with noncontributory medical history, presents to the ED with sudden onset of lower abdominal discomfort with associated nausea and vomiting.  Patient endorse chills primarily right lower quadrant pain that started to palpation.  She denies any recent fevers or vaginal bleeding.  She denies any previous abdominal surgeries.  She presents to the ED for evaluation of right-sided symptoms. ?  ? ? ?Physical Exam  ? ?Triage Vital Signs: ?ED Triage Vitals  ?Enc Vitals Group  ?   BP 12/31/21 1255 125/72  ?   Pulse Rate 12/31/21 1255 92  ?   Resp 12/31/21 1255 18  ?   Temp 12/31/21 1255 99.3 ?F (37.4 ?C)  ?   Temp Source 12/31/21 1255 Oral  ?   SpO2 12/31/21 1255 100 %  ?   Weight 12/31/21 1256 178 lb (80.7 kg)  ?   Height 12/31/21 1256 5\' 7"  (1.702 m)  ?   Head Circumference --   ?   Peak Flow --   ?   Pain Score 12/31/21 1307 8  ?   Pain Loc --   ?   Pain Edu? --   ?   Excl. in GC? --   ? ? ?Most recent vital signs: ?Vitals:  ? 12/31/21 1255  ?BP: 125/72  ?Pulse: 92  ?Resp: 18  ?Temp: 99.3 ?F (37.4 ?C)  ?SpO2: 100%  ? ? ?General Awake, no distress.  ?CV:  Good peripheral perfusion.  ?RESP:  Normal effort.  ?ABD:  No distention. Soft, tender to palp over the RLQ.  ? ? ?ED Results / Procedures / Treatments  ? ?Labs ?(all labs ordered are listed, but only abnormal results are displayed) ?Labs Reviewed  ?COMPREHENSIVE METABOLIC PANEL - Abnormal; Notable for the following components:  ?    Result Value  ? Glucose, Bld 100 (*)   ? All other components within normal limits  ?CBC - Abnormal; Notable for the following components:  ? WBC 12.3 (*)   ? All other components within normal limits  ?URINALYSIS, ROUTINE W REFLEX MICROSCOPIC - Abnormal; Notable for the  following components:  ? Color, Urine YELLOW (*)   ? APPearance CLEAR (*)   ? All other components within normal limits  ?LIPASE, BLOOD  ?POC URINE PREG, ED  ? ? ? ?EKG ? ? ?RADIOLOGY ? ?I personally viewed and evaluated these images as part of my medical decision making, as well as reviewing the written report by the radiologist. ? ?ED Provider Interpretation: acute appendicitis} ? ?CT ABDOMEN PELVIS W CONTRAST ? ?Result Date: 12/31/2021 ?CLINICAL DATA:  Right lower quadrant abdominal pain with nausea and vomiting of unspecified duration. Leukocytosis. EXAM: CT ABDOMEN AND PELVIS WITH CONTRAST TECHNIQUE: Multidetector CT imaging of the abdomen and pelvis was performed using the standard protocol following bolus administration of intravenous contrast. RADIATION DOSE REDUCTION: This exam was performed according to the departmental dose-optimization program which includes automated exposure control, adjustment of the mA and/or kV according to patient size and/or use of iterative reconstruction technique. CONTRAST:  03/02/2022 OMNIPAQUE IOHEXOL 300 MG/ML  SOLN COMPARISON:  None Available. FINDINGS: Lower chest: Clear lung bases. No significant pleural or pericardial effusion. Hepatobiliary: The liver is normal in density without  suspicious focal abnormality. No evidence of gallstones, gallbladder wall thickening or biliary dilatation. Pancreas: Unremarkable. No pancreatic ductal dilatation or surrounding inflammatory changes. Spleen: Normal in size without focal abnormality. Adrenals/Urinary Tract: Both adrenal glands appear normal. Assessment for small renal calculi limited by contrast in the calices. No evidence of ureteral calculus or hydronephrosis. There is no focal renal abnormality. The bladder appears unremarkable for its degree of distention. Stomach/Bowel: No enteric contrast administered. The stomach and small bowel appear unremarkable. Retrocecal appendix located in the right false pelvis demonstrates wall  thickening, distension to 11 mm and moderate surrounding inflammation consistent with acute appendicitis. No focal extraluminal fluid or air collection identified. Moderate stool throughout the colon. No evidence of colonic wall thickening or significant distention. Vascular/Lymphatic: There are no enlarged abdominal or pelvic lymph nodes. No significant vascular findings. Reproductive: The uterus and ovaries appear unremarkable. No adnexal mass. Other: As above, right lower quadrant inflammatory changes consistent with acute appendicitis. Inflammatory changes extend along the right posterior pararenal fascia. No focal fluid collection, ascites or pneumoperitoneum. Musculoskeletal: No acute or significant osseous findings. IMPRESSION: 1. Findings consistent with acute appendicitis. There are moderate peri-appendiceal inflammatory changes, but no definitive signs of rupture or abscess. 2. These results were called by telephone at the time of interpretation on 12/31/2021 at 2:29 pm to Charlsie Quest, PA for provider [name] , who verbally acknowledged these results. Electronically Signed   By: Carey Bullocks M.D.   On: 12/31/2021 14:30   ? ? ?PROCEDURES: ? ?Critical Care performed: No ? ?Procedures ? ? ?MEDICATIONS ORDERED IN ED: ?Medications  ?sodium chloride 0.9 % bolus 1,000 mL (has no administration in time range)  ?ondansetron (ZOFRAN) injection 4 mg (has no administration in time range)  ?iohexol (OMNIPAQUE) 300 MG/ML solution 100 mL (100 mLs Intravenous Contrast Given 12/31/21 1408)  ? ? ? ?IMPRESSION / MDM / ASSESSMENT AND PLAN / ED COURSE  ?I reviewed the triage vital signs and the nursing notes. ?             ?               ? ?Differential diagnosis includes, but is not limited to, ovarian cyst, ovarian torsion, acute appendicitis, diverticulitis, urinary tract infection/pyelonephritis, endometriosis, bowel obstruction, colitis, renal colic, gastroenteritis, hernia, fibroids, endometriosis, pregnancy  related pain including ectopic pregnancy, etc.  ? ? ?----------------------------------------- ?2:47 PM on 12/31/2021 ?----------------------------------------- ?Dr. Maia Plan in to evaluate the patient. She agrees to proceed with surgical intervention. Questions were encouraged and answered by him.  ? ?Patient to the ED with acute onset of right lower quadrant abdominal pain with associated anorexia, nausea and vomiting.  Patient is evaluated for complaints in the ED, found to have a leukocytosis at 12+, and concerning abdominal exam with right lower quadrant tenderness mild peritoneal signs.  Patient was evaluated with a CT which did confirm an acute appendicitis without evidence of abscess or rupture. Patient will be admitted to the surgical service for imminent laparoscopic appendectomy. ? ? ?FINAL CLINICAL IMPRESSION(S) / ED DIAGNOSES  ? ?Final diagnoses:  ?Acute appendicitis with localized peritonitis, without perforation, abscess, or gangrene  ? ? ? ?Rx / DC Orders  ? ?ED Discharge Orders   ? ? None  ? ?  ? ? ? ?Note:  This document was prepared using Dragon voice recognition software and may include unintentional dictation errors. ? ?  ?Lissa Hoard, PA-C ?12/31/21 1452 ? ?  ?Delton Prairie, MD ?12/31/21 1525 ? ?

## 2021-12-31 NOTE — ED Triage Notes (Signed)
Pt via POV from home. Pt c/o RLQ pain, states its tender on palpation. Pt endorses nausea and vomiting. Denies fevers. Denies vaginal bleeding. Denies urinary symptoms. Denies any abd surgeries. Pt is A&OX4 and NAD  ?

## 2021-12-31 NOTE — Anesthesia Postprocedure Evaluation (Signed)
Anesthesia Post Note ? ?Patient: Nakeisha Greenhouse ? ?Procedure(s) Performed: XI ROBOTIC LAPAROSCOPIC ASSISTED APPENDECTOMY (Abdomen) ? ?Patient location during evaluation: PACU ?Anesthesia Type: General ?Level of consciousness: awake and alert ?Pain management: pain level controlled ?Vital Signs Assessment: post-procedure vital signs reviewed and stable ?Respiratory status: spontaneous breathing, nonlabored ventilation, respiratory function stable and patient connected to nasal cannula oxygen ?Cardiovascular status: blood pressure returned to baseline and stable ?Postop Assessment: no apparent nausea or vomiting ?Anesthetic complications: no ? ? ?No notable events documented. ? ? ?Last Vitals:  ?Vitals:  ? 12/31/21 1523 12/31/21 1700  ?BP: 125/88 105/62  ?Pulse: 86 82  ?Resp: 16 18  ?Temp: 36.9 ?C 36.9 ?C  ?SpO2: 100% 100%  ?  ?Last Pain:  ?Vitals:  ? 12/31/21 1700  ?TempSrc:   ?PainSc: Asleep  ? ? ?  ?  ?  ?  ?  ?  ? ?Cleda Mccreedy Marshell Rieger ? ? ? ? ?

## 2021-12-31 NOTE — Transfer of Care (Signed)
Immediate Anesthesia Transfer of Care Note ? ?Patient: Holly Wyatt ? ?Procedure(s) Performed: XI ROBOTIC LAPAROSCOPIC ASSISTED APPENDECTOMY (Abdomen) ? ?Patient Location: PACU ? ?Anesthesia Type:General ? ?Level of Consciousness: drowsy ? ?Airway & Oxygen Therapy: Patient Spontanous Breathing and Patient connected to face mask oxygen ? ?Post-op Assessment: Report given to RN ? ?Post vital signs: stable ? ?Last Vitals:  ?Vitals Value Taken Time  ?BP 105/62 12/31/21 1700  ?Temp    ?Pulse 85 12/31/21 1702  ?Resp 15 12/31/21 1702  ?SpO2 100 % 12/31/21 1702  ?Vitals shown include unvalidated device data. ? ?Last Pain:  ?Vitals:  ? 12/31/21 1523  ?TempSrc: Oral  ?PainSc: 8   ?   ? ?  ? ?Complications: No notable events documented. ?

## 2021-12-31 NOTE — Anesthesia Procedure Notes (Signed)
Procedure Name: Intubation ?Date/Time: 12/31/2021 11:51 AM ?Performed by: Lerry Liner, CRNA ?Pre-anesthesia Checklist: Patient identified, Emergency Drugs available, Suction available and Patient being monitored ?Patient Re-evaluated:Patient Re-evaluated prior to induction ?Oxygen Delivery Method: Circle system utilized ?Preoxygenation: Pre-oxygenation with 100% oxygen ?Induction Type: IV induction ?Ventilation: Mask ventilation without difficulty ?Laryngoscope Size: McGraph and 3 ?Grade View: Grade I ?Tube type: Oral ?Tube size: 7.0 mm ?Number of attempts: 1 ?Airway Equipment and Method: Stylet and Oral airway ?Placement Confirmation: ETT inserted through vocal cords under direct vision, positive ETCO2 and breath sounds checked- equal and bilateral ?Secured at: 22 cm ?Tube secured with: Tape ?Dental Injury: Teeth and Oropharynx as per pre-operative assessment  ? ? ? ? ?

## 2021-12-31 NOTE — ED Notes (Signed)
Consent signed per Surgeon order  ?

## 2021-12-31 NOTE — Op Note (Signed)
Pre-op Diagnosis: Acute appendicitis  ? ?Post op Diagnosis: Acute appenditicis ? ?Procedure: Robotic assisted laparoscopic appendectomy. ? ?Anesthesia: GETA ? ?Surgeon: Carolan Shiver, MD, FACS ? ?Wound Classification: clean contaminated ? ?Specimen: Appendix ? ?Complications: None ? ?Estimated Blood Loss: 3 mL ? ? ?Indications: Patient is a 31 y.o. female  presented with above right lower quadrant pain. CT scan shows acute appendicitis.    ? ?FIndings: ?1.  Suppurative appendix ?2. No peri-appendiceal abscess or phlegmon ?3. Normal anatomy ?4. Adequate hemostasis.  ? ?Description of procedure: The patient was placed on the operating table in the supine position. General anesthesia was induced. A time-out was completed verifying correct patient, procedure, site, positioning, and implant(s) and/or special equipment prior to beginning this procedure. The abdomen was prepped and draped in the usual sterile fashion.  ? ?Palmer's point located and Veress needle was inserted.  After confirming 2 clicks and a positive saline drop test, gas insufflation was initiated until the abdominal pressure was measured at 15 mmHg.  Afterwards, the Veress needle was removed and a 8 mm port was placed in left upper quadrant area using Optiview technique.  After local was infused, 3 additional incision on the left abdominal wall were made 5 cm apart.  An 12 mm port and two other 8 mm ports were placed under direct visualization.  No injuries from trocar placements were noted.  The table was placed in the Trendelenburg position with the right side elevated.  With the use of Tip up grasper, Force Bipolar and Vessel sealer, an inflamed appendix was identified and elevated.  Window created at base of appendix in the mesentery.   ? ?The mesoappendix was divided with vessel sealer.  The base of the appendix was ligated with a pursestring using a 3 -0 V lock.  The appendix was divided with scissors.  The appendix orifice was imbricated.   The suppurative fluid was absorbed with mini laps. ? ?The appendiceal stump was examined and hemostasis noted. No other pathology was identified within pelvis. The 12 mm trocar removed and port site closed with PMI using 0 vicryl under direct vision. Remaining trocars were removed under direct vision. No bleeding was noted.The abdomen was allowed to collapse.  All skin incisions then closed with subcuticular sutures Monocryl 4-0.  Wounds then dressed with dermabond. ? ?The patient tolerated the procedure well, awakened from anesthesia and was taken to the postanesthesia care unit in satisfactory condition.  Sponge count and instrument count correct at the end of the procedure. ? ?

## 2022-01-01 ENCOUNTER — Encounter: Payer: Self-pay | Admitting: General Surgery

## 2022-01-01 MED ORDER — HYDROCODONE-ACETAMINOPHEN 5-325 MG PO TABS: 1.0000 | ORAL_TABLET | ORAL | 0 refills | Status: AC | PRN

## 2022-01-01 NOTE — Discharge Instructions (Addendum)

## 2022-01-01 NOTE — Discharge Summary (Signed)
?  Patient ID: ?Holly Wyatt ?MRN: [id number] ?DOB/AGE: September 16, 1990 30 y.o. ? ?Admit date: 12/31/2021 ?Discharge date: 01/01/2022 ? ? ?Discharge Diagnoses:  ?Principal Problem: ?  Acute appendicitis ? ? ?Procedures: Robotic assisted laparoscopic appendectomy ? ?Hospital Course: Patient admitted with acute appendicitis.  She underwent robotic assisted laparoscopic appendectomy.  She tolerated the procedure well.  She has been recovering adequately.  Today the pain controlled.  Patient is tolerating diet.  Patient ambulating.  Incisions are dry and clean. ? ?Physical Exam ?Constitutional:   ?   Appearance: She is well-developed.  ?HENT:  ?   Head: Normocephalic.  ?Cardiovascular:  ?   Rate and Rhythm: Normal rate and regular rhythm.  ?Pulmonary:  ?   Effort: Pulmonary effort is normal.  ?Abdominal:  ?   General: Abdomen is flat. Bowel sounds are normal.  ?   Palpations: Abdomen is soft.  ?Skin: ?   Capillary Refill: Capillary refill takes less than 2 seconds.  ?Neurological:  ?   Mental Status: She is alert and oriented to person, place, and time.  ? ? ? ?Consults: None ? ?Disposition: Discharge disposition: 01-Home or Self Care ? ? ? ? ? ? ?Discharge Instructions   ? ? Diet - low sodium heart healthy   Complete by: As directed ?  ? Increase activity slowly   Complete by: As directed ?  ? ?  ? ?Allergies as of 01/01/2022   ?No Known Allergies ?  ? ?  ?Medication List  ?  ? ?TAKE these medications   ? ?aspirin 325 MG tablet ?Take 325 mg by mouth 3 (three) times daily as needed for moderate pain. ?  ?HYDROcodone-acetaminophen 5-325 MG tablet ?Commonly known as: Norco ?Take 1 tablet by mouth every 4 (four) hours as needed for up to 3 days for moderate pain. ?  ?norethindrone-ethinyl estradiol-FE 1-20 MG-MCG tablet ?Commonly known as: LOESTRIN FE ?Take 1 tablet by mouth daily. ?  ? ?  ? ? Follow-up Information   ? ? Carolan Shiver, MD Follow up in 2 week(s).   ?Specialty: General Surgery ?Why: Follow up after  appendectomy ?Contact information: ?[address] ?[place] [address] ?[phone] ? ? ?  ?  ? ?  ?  ? ?  ? ? ?

## 2022-01-01 NOTE — Progress Notes (Signed)
Discharge instructions given and reviewed with pt and family. Pt educated on follow up care, appointments and when to notify provider, questions invited and answered. I Pt and family noted understanding  to teaching/education.Pt escorted off unit by volunteer in wheelchair. No distress noted. ?

## 2022-01-02 LAB — SURGICAL PATHOLOGY

## 2022-09-10 IMAGING — CT CT ABD-PELV W/ CM
2 of 4 series · 15 of 46 positions shown, 17 images · IV contrast (APPLIED)
Comparison: None Available.

CLINICAL DATA: Right lower quadrant abdominal pain with nausea and
vomiting of unspecified duration. Leukocytosis.

EXAM:
CT ABDOMEN AND PELVIS WITH CONTRAST
TECHNIQUE: Multidetector CT imaging of the abdomen and pelvis was performed
using the standard protocol following bolus administration of
intravenous contrast.

[Series 2: abdomen 5.0 · axial · 0.72mm/px · z∈[+1012,+1422]mm · 12 of 94 slices shown, 14 images]
[im 6/94  soft-tissue]
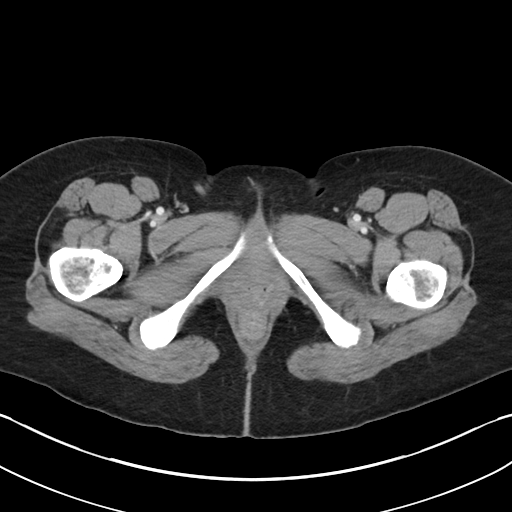
[im 6/94  bone]
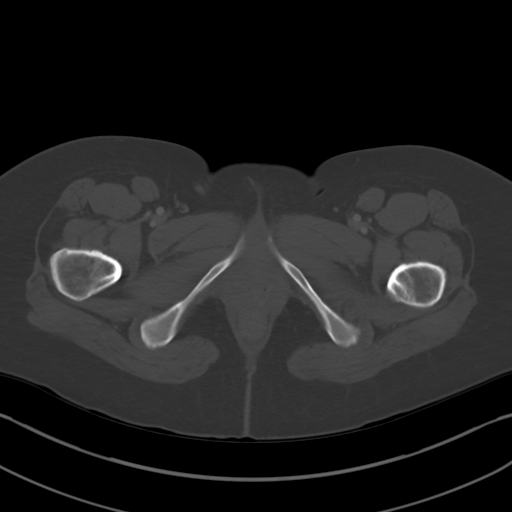
[im 16/94  soft-tissue]
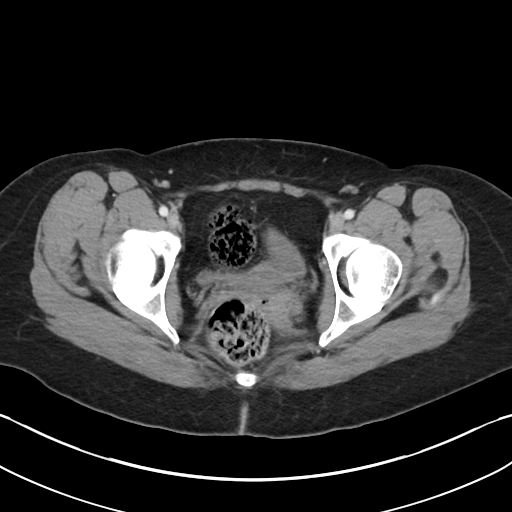
[im 21/94  soft-tissue]
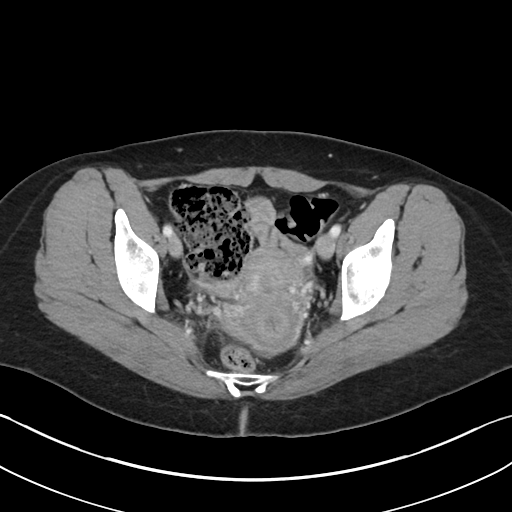
[im 26/94  soft-tissue]
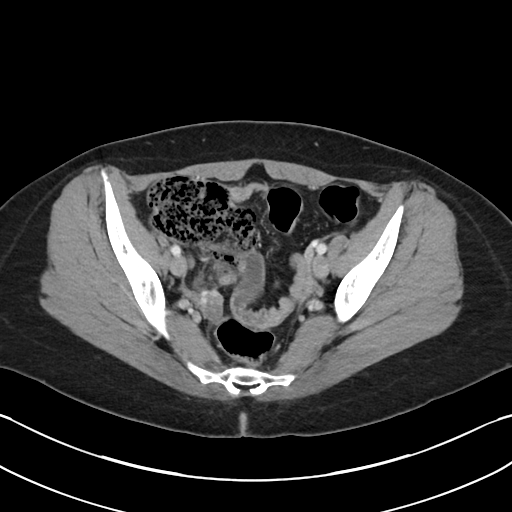
[im 37/94  soft-tissue]
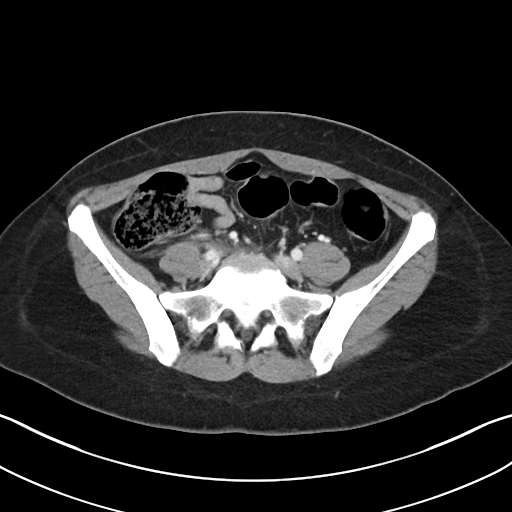
[im 42/94  soft-tissue]
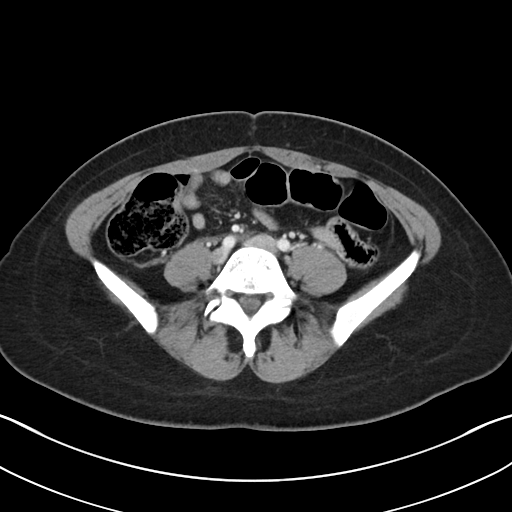
[im 52/94  soft-tissue]
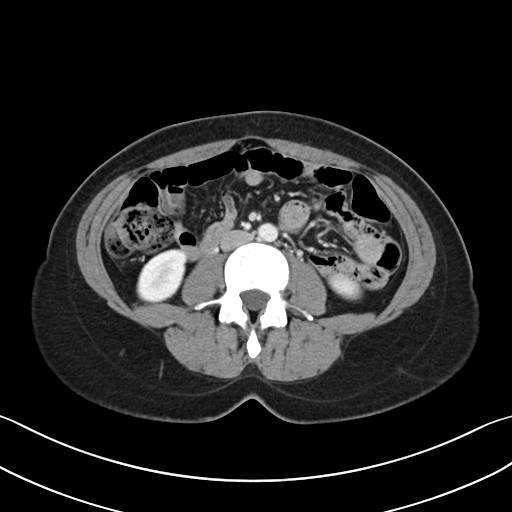
[im 57/94  soft-tissue]
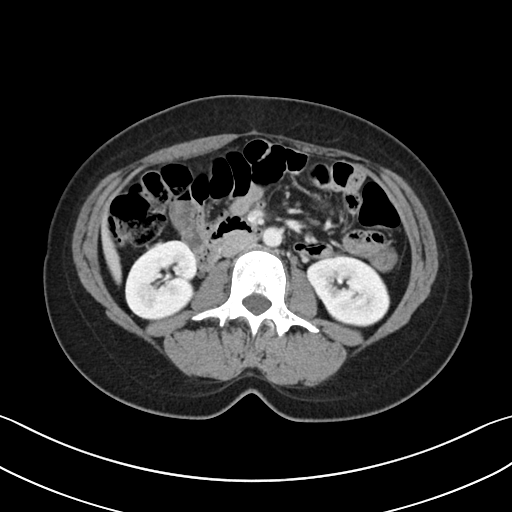
[im 68/94  soft-tissue]
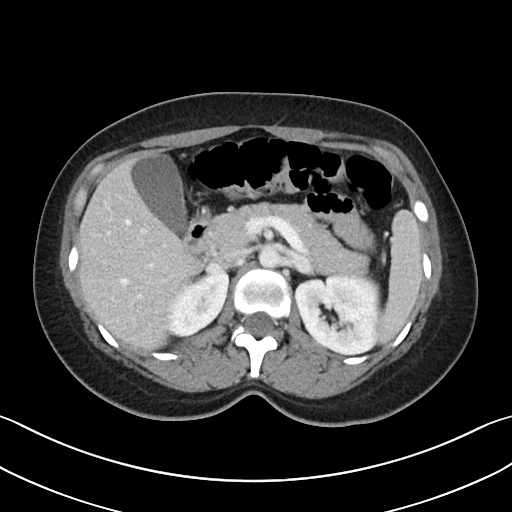
[im 68/94  bone]
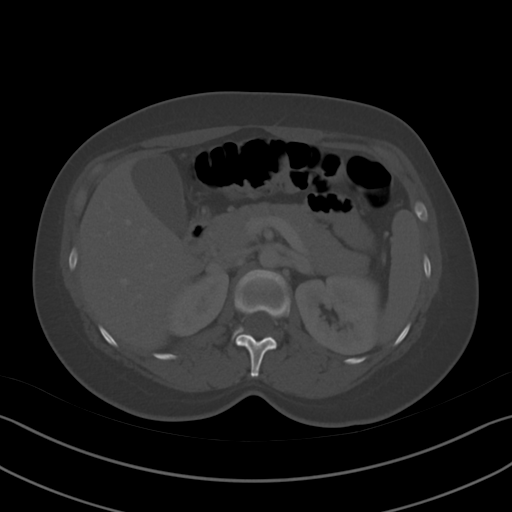
[im 73/94  soft-tissue]
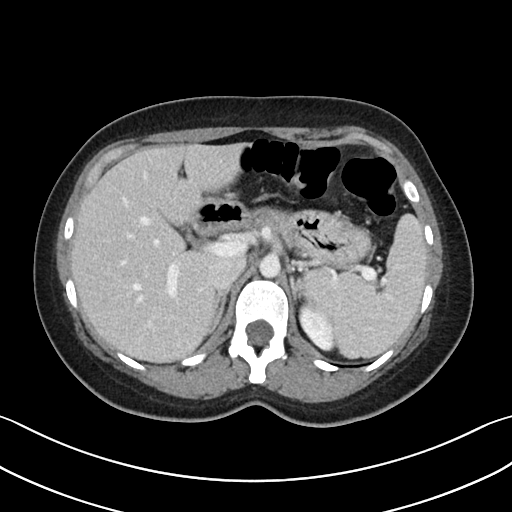
[im 78/94  soft-tissue]
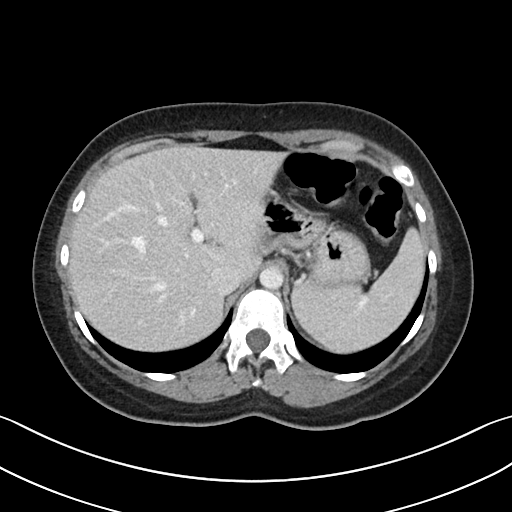
[im 88/94  soft-tissue]
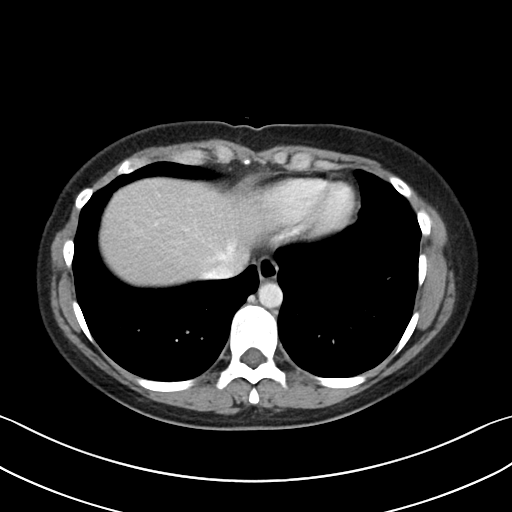

[Series 5: abdomen 3.0 mpr cor · coronal · 0.72mm/px · 3 of 85 slices shown]
[im 29/85  soft-tissue]
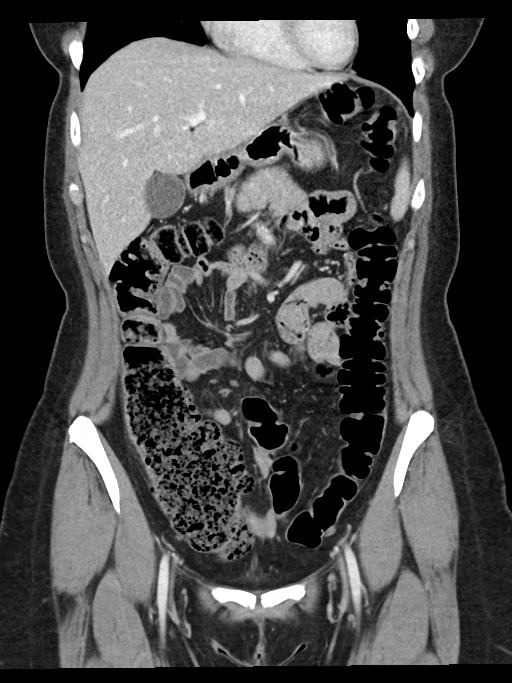
[im 38/85  soft-tissue]
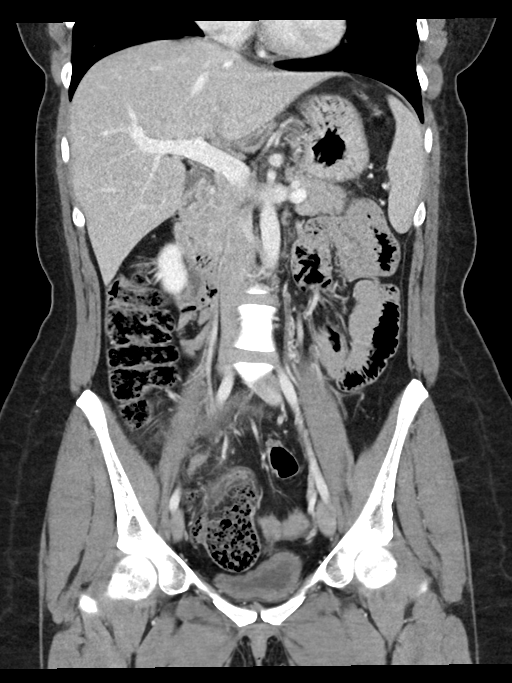
[im 47/85  soft-tissue]
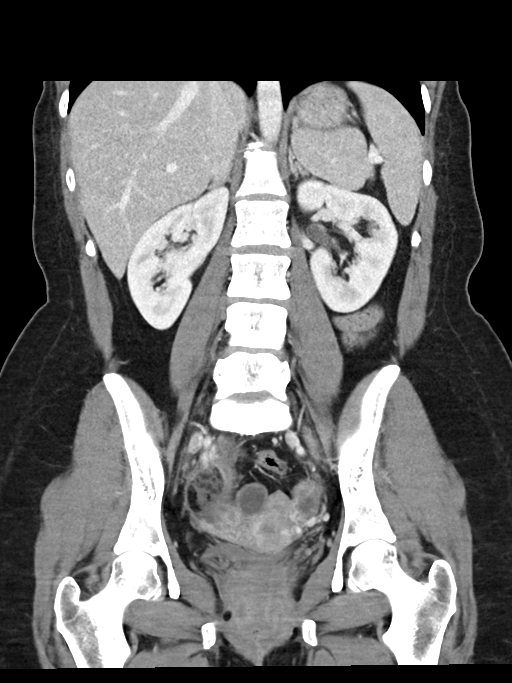

[15 of 46 positions shown; findings below may reference images not displayed]

RADIATION DOSE REDUCTION: This exam was performed according to the
departmental dose-optimization program which includes automated
exposure control, adjustment of the mA and/or kV according to
patient size and/or use of iterative reconstruction technique.

CONTRAST:  100mL OMNIPAQUE IOHEXOL 300 MG/ML  SOLN
FINDINGS: Lower chest: Clear lung bases. No significant pleural or pericardial
effusion.

Hepatobiliary: The liver is normal in density without suspicious
focal abnormality. No evidence of gallstones, gallbladder wall
thickening or biliary dilatation.

Pancreas: Unremarkable. No pancreatic ductal dilatation or
surrounding inflammatory changes.

Spleen: Normal in size without focal abnormality.

Adrenals/Urinary Tract: Both adrenal glands appear normal.
Assessment for small renal calculi limited by contrast in the
calices. No evidence of ureteral calculus or hydronephrosis. There
is no focal renal abnormality. The bladder appears unremarkable for
its degree of distention.

Stomach/Bowel: No enteric contrast administered. The stomach and
small bowel appear unremarkable. Retrocecal appendix located in the
right false pelvis demonstrates wall thickening, distension to 11 mm
and moderate surrounding inflammation consistent with acute
appendicitis. No focal extraluminal fluid or air collection
identified. Moderate stool throughout the colon. No evidence of
colonic wall thickening or significant distention.

Vascular/Lymphatic: There are no enlarged abdominal or pelvic lymph
nodes. No significant vascular findings.

Reproductive: The uterus and ovaries appear unremarkable. No adnexal
mass.

Other: As above, right lower quadrant inflammatory changes
consistent with acute appendicitis. Inflammatory changes extend
along the right posterior pararenal fascia. No focal fluid
collection, ascites or pneumoperitoneum.

Musculoskeletal: No acute or significant osseous findings.
IMPRESSION: 1. Findings consistent with acute appendicitis. There are moderate
peri-appendiceal inflammatory changes, but no definitive signs of
rupture or abscess.
2. These results were called by telephone at the time of
interpretation on 12/31/2021 at [DATE] to Mayee Mazaba, PA for
provider BREONA CANCIO , who verbally acknowledged these results.

## 2022-10-11 ENCOUNTER — Ambulatory Visit
Admission: EM | Admit: 2022-10-11 | Discharge: 2022-10-11 | Disposition: A | Discharge status: Home / Self Care | Payer: BLUE CROSS/BLUE SHIELD

## 2022-10-11 DIAGNOSIS — H9311 Tinnitus, right ear: Secondary | ICD-10-CM | POA: Diagnosis not present

## 2022-10-11 NOTE — ED Triage Notes (Signed)
Patient presents to UC for cough, nasal congestion, ringing in right ear,  and sore throat x 2 days ago. Taking tylenol.

## 2022-10-11 NOTE — Discharge Instructions (Signed)
Follow-up with evaluation by ENT.

## 2022-10-11 NOTE — ED Provider Notes (Signed)
Roderic Palau    CSN: [id number] Arrival date & time: 10/11/22  1529      History   Chief Complaint Chief Complaint  Patient presents with   Cough   Sore Throat    HPI Holly Wyatt is a 32 y.o. female.    Cough Sore Throat    Presents to urgent care with complaint of cough, nasal congestion, ringing in right ear, sore throat x 2 days.  Denies sinus pressure.  States she awoke from a nap with ringing in her ear which has been constant since.  History reviewed. No pertinent past medical history.  Patient Active Problem List   Diagnosis Date Noted   Acute appendicitis 12/31/2021    Past Surgical History:  Procedure Laterality Date   XI ROBOTIC LAPAROSCOPIC ASSISTED APPENDECTOMY N/A 12/31/2021   Procedure: XI ROBOTIC LAPAROSCOPIC ASSISTED APPENDECTOMY;  Surgeon: Herbert Pun, MD;  Location: [facility] ORS;  Service: General;  Laterality: N/A;    OB History     Gravida  1   Para      Term      Preterm      AB      Living         SAB      IAB      Ectopic      Multiple      Live Births               Home Medications    Prior to Admission medications   Medication Sig Start Date End Date Taking? Authorizing Provider  aspirin 325 MG tablet Take 325 mg by mouth 3 (three) times daily as needed for moderate pain. Patient not taking: Reported on 12/31/2021    [provider]  norethindrone-ethinyl estradiol-FE (LOESTRIN FE) 1-20 MG-MCG tablet Take 1 tablet by mouth daily. 08/03/17   [provider]    Family History History reviewed. No pertinent family history.  Social History Social History   Tobacco Use   Smoking status: Never   Smokeless tobacco: Never  Vaping Use   Vaping Use: Never used  Substance Use Topics   Alcohol use: Yes    Alcohol/week: 2.0 standard drinks of alcohol    Types: 2 Standard drinks or equivalent per week    Comment: monthly   Drug use: Never     Allergies   Patient has no  known allergies.   Review of Systems Review of Systems  Respiratory:  Positive for cough.      Physical Exam Triage Vital Signs ED Triage Vitals [10/11/22 1543]  Enc Vitals Group     BP 135/79     Pulse Rate 82     Resp 16     Temp 97.7 F (36.5 C)     Temp Source Temporal     SpO2 99 %     Weight      Height      Head Circumference      Peak Flow      Pain Score      Pain Loc      Pain Edu?      Excl. in [place]?    No data found.  Updated Vital Signs BP 135/79 (BP Location: Left Arm)   Pulse 82   Temp 97.7 F (36.5 C) (Temporal)   Resp 16   LMP 12/10/2021 (Approximate)   SpO2 99%   Visual Acuity Right Eye Distance:   Left Eye Distance:   Bilateral Distance:  Right Eye Near:   Left Eye Near:    Bilateral Near:     Physical Exam Vitals reviewed.  Constitutional:      Appearance: She is well-developed.  HENT:     Right Ear: A foreign body is present.     Ears:     Comments: Possibly cone shaped foreign object deep in the R EAC. Skin:    General: Skin is warm and dry.  Neurological:     General: No focal deficit present.     Mental Status: She is alert and oriented to person, place, and time.  Psychiatric:        Mood and Affect: Mood normal.        Behavior: Behavior normal.      UC Treatments / Results  Labs (all labs ordered are listed, but only abnormal results are displayed) Labs Reviewed - No data to display  EKG   Radiology No results found.  Procedures Procedures (including critical care time)  Medications Ordered in UC Medications - No data to display  Initial Impression / Assessment and Plan / UC Course  I have reviewed the triage vital signs and the nursing notes.  Pertinent labs & imaging results that were available during my care of the patient were reviewed by me and considered in my medical decision making (see chart for details).   Patient is afebrile here without recent antipyretics. Satting well on room air.  Overall is well appearing, well hydrated, without respiratory distress.  Foreign object deep in the right EAC.  Patient's symptoms are consistent with an acute viral process, likely resulting in sinusitis which is at least, in part, responsible for her tinnitus.  However, there is a foreign object in the right EAC and patient has no recollection of how/what/how long it has been present.  Recommended patient to be evaluated by ENT, either by obtaining referral from her PCP or by going to the ED.  I do not feel that this is an emergent concern, but considering her acute symptoms, could not rule this out.   Final Clinical Impressions(s) / UC Diagnoses   Final diagnoses:  None   Discharge Instructions   None    ED Prescriptions   None    PDMP not reviewed this encounter.   Rose Phi, [place] 10/11/22 [phone]

## 2023-05-05 DIAGNOSIS — Z3490 Encounter for supervision of normal pregnancy, unspecified, unspecified trimester: Secondary | ICD-10-CM | POA: Diagnosis not present

## 2023-05-19 DIAGNOSIS — Z3A38 38 weeks gestation of pregnancy: Secondary | ICD-10-CM | POA: Diagnosis not present

## 2023-05-19 DIAGNOSIS — O471 False labor at or after 37 completed weeks of gestation: Secondary | ICD-10-CM | POA: Diagnosis not present

## 2023-05-20 DIAGNOSIS — Z3A38 38 weeks gestation of pregnancy: Secondary | ICD-10-CM | POA: Diagnosis not present

## 2023-05-20 DIAGNOSIS — O471 False labor at or after 37 completed weeks of gestation: Secondary | ICD-10-CM | POA: Diagnosis not present
